# Patient Record
Sex: Female | Born: 1995 | Hispanic: Yes | Marital: Married | Smoking: Never smoker
Health system: Southern US, Community
[De-identification: ages and names within clinical notes are randomized; demographics above are authoritative.]

---

## 2017-02-11 ENCOUNTER — Emergency Department (HOSPITAL_COMMUNITY)

## 2017-02-11 ENCOUNTER — Emergency Department (HOSPITAL_COMMUNITY)
Admission: EM | Admit: 2017-02-11 | Discharge: 2017-02-11 | Disposition: A | Discharge status: Home / Self Care | Attending: Emergency Medicine | Admitting: Emergency Medicine

## 2017-02-11 ENCOUNTER — Encounter (HOSPITAL_COMMUNITY): Payer: Self-pay | Admitting: Emergency Medicine

## 2017-02-11 DIAGNOSIS — M79642 Pain in left hand: Secondary | ICD-10-CM | POA: Diagnosis not present

## 2017-02-11 DIAGNOSIS — S0990XA Unspecified injury of head, initial encounter: Secondary | ICD-10-CM | POA: Diagnosis present

## 2017-02-11 DIAGNOSIS — Y929 Unspecified place or not applicable: Secondary | ICD-10-CM | POA: Diagnosis not present

## 2017-02-11 DIAGNOSIS — S0101XA Laceration without foreign body of scalp, initial encounter: Secondary | ICD-10-CM | POA: Insufficient documentation

## 2017-02-11 DIAGNOSIS — Y999 Unspecified external cause status: Secondary | ICD-10-CM | POA: Insufficient documentation

## 2017-02-11 DIAGNOSIS — M25562 Pain in left knee: Secondary | ICD-10-CM | POA: Insufficient documentation

## 2017-02-11 DIAGNOSIS — Y939 Activity, unspecified: Secondary | ICD-10-CM | POA: Diagnosis not present

## 2017-02-11 DIAGNOSIS — M25561 Pain in right knee: Secondary | ICD-10-CM | POA: Insufficient documentation

## 2017-02-11 MED ORDER — OXYCODONE-ACETAMINOPHEN 5-325 MG PO TABS
1.0000 | ORAL_TABLET | Freq: Once | ORAL | Status: AC
  Administered 2017-02-11: 1 via ORAL
  Filled 2017-02-11: qty 1

## 2017-02-11 MED ORDER — ONDANSETRON 4 MG PO TBDP
4.0000 mg | ORAL_TABLET | Freq: Once | ORAL | Status: AC
Start: 2017-02-11 — End: 2017-02-11
  Administered 2017-02-11: 4 mg via ORAL
  Filled 2017-02-11: qty 1

## 2017-02-11 NOTE — ED Notes (Addendum)
Patient has a laceration on upper mid back of head. Patient c/o of headache pain 5/10. Patient has bruises and swelling on upper shin near both knees. Patient c/o of left thumb pain.

## 2017-02-11 NOTE — Discharge Instructions (Signed)
The scan of your head was reassuring. Xray of your knees and hand do not show fracture.   Please gently wash laceration with warm soapy water daily.   Return to the ER or your primary doctor's office for staple removal in 7 days.   You can take ibuprofen as needed for pain.   Return to the ER if you have worsening headache with vomiting that does not stop.
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
## 2017-02-11 NOTE — ED Notes (Signed)
Patient denies any pain. Patient c/o of " feeling woozy". Patient aware to call for help for bathroom assistance for bed pan.
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
## 2017-02-18 ENCOUNTER — Emergency Department (HOSPITAL_COMMUNITY): Admission: EM | Admit: 2017-02-18 | Discharge: 2017-02-18 | Discharge status: Absent without leave

## 2017-03-11 ENCOUNTER — Emergency Department (HOSPITAL_COMMUNITY)

## 2017-03-11 ENCOUNTER — Other Ambulatory Visit: Payer: Self-pay

## 2017-03-11 ENCOUNTER — Emergency Department (HOSPITAL_COMMUNITY)
Admission: EM | Admit: 2017-03-11 | Discharge: 2017-03-11 | Disposition: A | Discharge status: Home / Self Care | Attending: Emergency Medicine | Admitting: Emergency Medicine

## 2017-03-11 ENCOUNTER — Encounter (HOSPITAL_COMMUNITY): Payer: Self-pay | Admitting: Emergency Medicine

## 2017-03-11 DIAGNOSIS — Y990 Civilian activity done for income or pay: Secondary | ICD-10-CM | POA: Insufficient documentation

## 2017-03-11 DIAGNOSIS — S61411A Laceration without foreign body of right hand, initial encounter: Secondary | ICD-10-CM | POA: Diagnosis not present

## 2017-03-11 DIAGNOSIS — W540XXA Bitten by dog, initial encounter: Secondary | ICD-10-CM | POA: Diagnosis not present

## 2017-03-11 DIAGNOSIS — Y9289 Other specified places as the place of occurrence of the external cause: Secondary | ICD-10-CM | POA: Insufficient documentation

## 2017-03-11 DIAGNOSIS — Y93K9 Activity, other involving animal care: Secondary | ICD-10-CM | POA: Diagnosis not present

## 2017-03-11 LAB — POC URINE PREG, ED: Preg Test, Ur: NEGATIVE

## 2017-03-11 MED ORDER — AMOXICILLIN-POT CLAVULANATE 875-125 MG PO TABS: 1.0000 | ORAL_TABLET | Freq: Two times a day (BID) | ORAL | 0 refills | Status: AC

## 2017-03-11 MED ORDER — BACITRACIN ZINC 500 UNIT/GM EX OINT
TOPICAL_OINTMENT | Freq: Once | CUTANEOUS | Status: AC
  Administered 2017-03-11: 16:00:00 via TOPICAL

## 2017-03-11 NOTE — ED Provider Notes (Signed)
[facility]-EMERGENCY DEPT Provider Note   CSN: [id number] Arrival date & time: 03/11/17  4098     History   Chief Complaint Chief Complaint  Patient presents with  . Animal Bite    HPI Bethany Turner is a 22 y.o. female who presents for evaluation of dog bite to right hand that occurred approximately 2 hours prior to ED arrival.  Patient reports that the incident occurred at work where she works for [employer].  She states that the dog is up-to-date on vaccines per owner.  Patient states her tetanus is up-to-date.  Patient took ibuprofen prior to ED arrival.  Patient denies any numbness/weakness.  The history is provided by the patient.    History reviewed. No pertinent past medical history.  There are no active problems to display for this patient.   History reviewed. No pertinent surgical history.  OB History    No data available       Home Medications    Prior to Admission medications   Medication Sig Start Date End Date Taking? Authorizing Provider  amoxicillin-clavulanate (AUGMENTIN) 875-125 MG tablet Take 1 tablet by mouth every 12 (twelve) hours for 7 days. 03/11/17 03/18/17  Maxwell Caul, PA-C    Family History No family history on file.  Social History Social History   Tobacco Use  . Smoking status: Never Smoker  . Smokeless tobacco: Never Used  Substance Use Topics  . Alcohol use: No    Frequency: Never  . Drug use: Not on file     Allergies   Patient has no known allergies.   Review of Systems Review of Systems  Skin: Positive for wound.  Neurological: Negative for weakness and numbness.     Physical Exam Updated Vital Signs BP 118/73 (BP Location: Left Arm)   Pulse 64   Temp 98.4 F (36.9 C) (Oral)   Resp 16   Wt 56.5 kg (124 lb 9.6 oz)   SpO2 100%   Physical Exam  Constitutional: She appears well-developed and well-nourished.  HENT:  Head: Normocephalic and atraumatic.  Eyes: Conjunctivae and  EOM are normal. Right eye exhibits no discharge. Left eye exhibits no discharge. No scleral icterus.  Cardiovascular:  Pulses:      Radial pulses are 2+ on the right side, and 2+ on the left side.  Pulmonary/Chest: Effort normal.  Musculoskeletal:  Full range of motion of right hand intact without difficulty.  Full range of motion of all 5 digits intact without difficulty.  Neurological: She is alert.  Skin: Skin is warm and dry. Capillary refill takes less than 2 seconds.  0.5 cm wound noted to the dorsal aspect of the right hand.  It does not overlie the joint. Good distal cap refill.  RUE is not dusky in appearance or cool to touch.  Psychiatric: She has a normal mood and affect. Her speech is normal and behavior is normal.  Nursing note and vitals reviewed.    ED Treatments / Results  Labs (all labs ordered are listed, but only abnormal results are displayed) Labs Reviewed  POC URINE PREG, ED    EKG  EKG Interpretation None       Radiology Dg Hand Complete Right  Result Date: 03/11/2017 CLINICAL DATA:  61 y/o F; dog bite. Pain across dorsum of hand to second and third metacarpals. EXAM: RIGHT HAND - COMPLETE 3+ VIEW COMPARISON:  None. FINDINGS: There is no evidence of fracture or dislocation. There is no evidence of  arthropathy or other focal bone abnormality. Soft tissues are unremarkable. IMPRESSION: Negative. Electronically Signed   By: Mitzi HansenLance  Furusawa-Stratton M.D.   On: 03/11/2017 14:26    Procedures Procedures (including critical care time)  Medications Ordered in ED Medications  bacitracin ointment (not administered)     Initial Impression / Assessment and Plan / ED Course  I have reviewed the triage vital signs and the nursing notes.  Pertinent labs & imaging results that were available during my care of the patient were reviewed by me and considered in my medical decision making (see chart for details).     22 year old female who presents for evaluation  of dog bite to the right hand that occurred this morning.  Patient works at a [employer].  She states that the dog is up-to-date on vaccines and that she personally knows the owner.  She reports that her tetanus is up-to-date and states that she had a tetanus shot last year.  No numbness/weakness. Patient is afebrile, non-toxic appearing, sitting comfortably on examination table. Vital signs reviewed and stable. Patient is neurovascularly intact.  On exam, patient does have a small 0.5 cm superficial wound noted to the dorsal aspect of the right hand.  It does not overlie the joint.  Patient will full range of motion of right hand intact without any difficulty.  Given that it is not hand, will plan for x-ray evaluation to evaluate for any bony abnormalities.  I discussed with patient regarding the rabies vaccine.  I discussed risk first benefits of not obtaining rabies vaccines, including but not limited to infection, death.  Patient understands full risks and benefits and declines rabies vaccine at this time given that she is sure that the dog is up-to-date on vaccines.  Patient exhibits full medical decision-making capacity.  I explained to patient that at any time if she changes her mind, she can come vaccine.  X-ray reviewed.  Negative for any acute fracture dislocation.  I discussed results with patient.  I personally irrigated the wound thoroughly and extensively with sterile saline water.  Wound is very superficial.  Bacitracin applied in the ED.  Will start patient on antibiotic therapy.  Patient with no known drug allergies.  Wound care instructions discussed with patient. Patient had ample opportunity for questions and discussion. All patient's questions were answered with full understanding. Strict return precautions discussed. Patient expresses understanding and agreement to plan.    Final Clinical Impressions(s) / ED Diagnoses   Final diagnoses:  Dog bite, initial encounter    ED Discharge  Orders        Ordered    amoxicillin-clavulanate (AUGMENTIN) 875-125 MG tablet  Every 12 hours     03/11/17 1451       Rosana HoesLayden, Lindsey A, PA-C 03/11/17 1506    Charlynne PanderYao, David Hsienta, MD 03/11/17 [phone]

## 2017-03-11 NOTE — ED Triage Notes (Signed)
Pt verbalizes bite by dog to right hand; up to date on shots.

## 2017-03-11 NOTE — Discharge Instructions (Signed)
You can take Tylenol or Ibuprofen as directed for pain. You can alternate Tylenol and Ibuprofen every 4 hours. If you take Tylenol at 1pm, then you can take Ibuprofen at 5pm. Then you can take Tylenol again at 9pm.   Take antibiotics as directed. Please take all of your antibiotics until finished.  Follow the RICE (Rest, Ice, Compression, Elevation) protocol as directed.   Monitor closely for any signs of infection, including warmth or redness around the area, drainage from the area, fever, redness or swelling that extends on the hand or any other worsening or concerning symptoms.  If you experience these symptoms, return to the emergency department immediately.

## 2017-03-11 NOTE — ED Notes (Signed)
Discharge instructions reviewed with patient. Patient verbalizes understanding. VSS.   

## 2017-03-11 NOTE — ED Notes (Signed)
Patient right hand wound cleaned with saline and dressed with bacitracin and bandaid.
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
